# Patient Record
Sex: Female | Born: 1940 | Race: White | Hispanic: No
Health system: Southern US, Community
[De-identification: ages and names within clinical notes are randomized; demographics above are authoritative.]

## PROBLEM LIST (undated history)

## (undated) ENCOUNTER — Emergency Department (HOSPITAL_COMMUNITY): Payer: Self-pay

---

## 1998-08-15 ENCOUNTER — Ambulatory Visit (HOSPITAL_COMMUNITY): Admission: RE | Admit: 1998-08-15 | Discharge: 1998-08-15 | Payer: Self-pay | Admitting: *Deleted

## 1999-03-01 ENCOUNTER — Other Ambulatory Visit: Admission: RE | Admit: 1999-03-01 | Discharge: 1999-03-01 | Payer: Self-pay | Admitting: *Deleted

## 2000-03-28 ENCOUNTER — Ambulatory Visit (HOSPITAL_COMMUNITY): Admission: RE | Admit: 2000-03-28 | Discharge: 2000-03-28 | Payer: Self-pay | Admitting: *Deleted

## 2001-07-04 ENCOUNTER — Other Ambulatory Visit: Admission: RE | Admit: 2001-07-04 | Discharge: 2001-07-04 | Payer: Self-pay | Admitting: Internal Medicine

## 2003-09-10 ENCOUNTER — Ambulatory Visit (HOSPITAL_COMMUNITY): Admission: RE | Admit: 2003-09-10 | Discharge: 2003-09-10 | Payer: Self-pay | Admitting: Internal Medicine

## 2005-01-25 ENCOUNTER — Ambulatory Visit (HOSPITAL_COMMUNITY): Admission: RE | Admit: 2005-01-25 | Discharge: 2005-01-25 | Payer: Self-pay | Admitting: Internal Medicine

## 2005-01-25 ENCOUNTER — Encounter (INDEPENDENT_AMBULATORY_CARE_PROVIDER_SITE_OTHER): Payer: Self-pay | Admitting: Cardiology

## 2005-02-12 ENCOUNTER — Encounter: Admission: RE | Admit: 2005-02-12 | Discharge: 2005-02-12 | Payer: Self-pay | Admitting: Cardiology

## 2005-02-19 ENCOUNTER — Ambulatory Visit (HOSPITAL_COMMUNITY): Admission: RE | Admit: 2005-02-19 | Discharge: 2005-02-19 | Payer: Self-pay | Admitting: Cardiology

## 2005-05-31 ENCOUNTER — Ambulatory Visit: Admission: RE | Admit: 2005-05-31 | Discharge: 2005-05-31 | Payer: Self-pay | Admitting: *Deleted

## 2005-10-10 ENCOUNTER — Ambulatory Visit (HOSPITAL_COMMUNITY): Admission: RE | Admit: 2005-10-10 | Discharge: 2005-10-10 | Payer: Self-pay | Admitting: Internal Medicine

## 2005-10-10 ENCOUNTER — Encounter: Payer: Self-pay | Admitting: Vascular Surgery

## 2007-07-09 ENCOUNTER — Ambulatory Visit (HOSPITAL_COMMUNITY): Admission: RE | Admit: 2007-07-09 | Discharge: 2007-07-09 | Payer: Self-pay | Admitting: Internal Medicine

## 2007-11-13 ENCOUNTER — Ambulatory Visit: Admission: RE | Admit: 2007-11-13 | Discharge: 2008-01-14 | Payer: Self-pay | Admitting: Radiation Oncology

## 2007-11-14 ENCOUNTER — Ambulatory Visit: Payer: Self-pay | Admitting: Oncology

## 2007-11-20 LAB — COMPREHENSIVE METABOLIC PANEL
Albumin: 2.8 g/dL — ABNORMAL LOW (ref 3.5–5.2)
Alkaline Phosphatase: 637 U/L — ABNORMAL HIGH (ref 39–117)
BUN: 6 mg/dL (ref 6–23)
CO2: 26 mEq/L (ref 19–32)
Calcium: 8.8 mg/dL (ref 8.4–10.5)
Chloride: 97 mEq/L (ref 96–112)
Glucose, Bld: 105 mg/dL — ABNORMAL HIGH (ref 70–99)
Potassium: 3.3 mEq/L — ABNORMAL LOW (ref 3.5–5.3)
Sodium: 135 mEq/L (ref 135–145)
Total Protein: 5.6 g/dL — ABNORMAL LOW (ref 6.0–8.3)

## 2007-11-20 LAB — CBC WITH DIFFERENTIAL/PLATELET
Basophils Absolute: 0.2 10*3/uL — ABNORMAL HIGH (ref 0.0–0.1)
EOS%: 0.9 % (ref 0.0–7.0)
HCT: 38.3 % (ref 34.8–46.6)
HGB: 12.9 g/dL (ref 11.6–15.9)
LYMPH%: 12.5 % — ABNORMAL LOW (ref 14.0–48.0)
MCH: 31 pg (ref 26.0–34.0)
MCV: 92.3 fL (ref 81.0–101.0)
MONO%: 7.5 % (ref 0.0–13.0)
NEUT%: 77.4 % — ABNORMAL HIGH (ref 39.6–76.8)
Platelets: 308 10*3/uL (ref 145–400)
lymph#: 1.1 10*3/uL (ref 0.9–3.3)

## 2007-11-21 ENCOUNTER — Ambulatory Visit (HOSPITAL_COMMUNITY): Admission: RE | Admit: 2007-11-21 | Discharge: 2007-11-22 | Payer: Self-pay | Admitting: Oncology

## 2007-11-26 ENCOUNTER — Ambulatory Visit (HOSPITAL_COMMUNITY): Admission: RE | Admit: 2007-11-26 | Discharge: 2007-11-26 | Payer: Self-pay | Admitting: Oncology

## 2007-11-27 ENCOUNTER — Ambulatory Visit: Admission: RE | Admit: 2007-11-27 | Discharge: 2007-11-27 | Payer: Self-pay | Admitting: Oncology

## 2007-11-27 ENCOUNTER — Encounter: Payer: Self-pay | Admitting: Oncology

## 2007-11-27 ENCOUNTER — Ambulatory Visit: Payer: Self-pay | Admitting: Vascular Surgery

## 2007-11-28 ENCOUNTER — Ambulatory Visit (HOSPITAL_COMMUNITY): Admission: RE | Admit: 2007-11-28 | Discharge: 2007-11-28 | Payer: Self-pay | Admitting: Interventional Radiology

## 2007-12-08 LAB — COMPREHENSIVE METABOLIC PANEL
ALT: 28 U/L (ref 0–35)
CO2: 25 mEq/L (ref 19–32)
Calcium: 8.7 mg/dL (ref 8.4–10.5)
Chloride: 95 mEq/L — ABNORMAL LOW (ref 96–112)
Creatinine, Ser: 0.51 mg/dL (ref 0.40–1.20)
Glucose, Bld: 103 mg/dL — ABNORMAL HIGH (ref 70–99)
Total Bilirubin: 2 mg/dL — ABNORMAL HIGH (ref 0.3–1.2)

## 2007-12-08 LAB — CBC WITH DIFFERENTIAL/PLATELET
BASO%: 1 % (ref 0.0–2.0)
Basophils Absolute: 0 10*3/uL (ref 0.0–0.1)
EOS%: 0.3 % (ref 0.0–7.0)
Eosinophils Absolute: 0 10*3/uL (ref 0.0–0.5)
HCT: 31.8 % — ABNORMAL LOW (ref 34.8–46.6)
HGB: 11.4 g/dL — ABNORMAL LOW (ref 11.6–15.9)
LYMPH%: 11.2 % — ABNORMAL LOW (ref 14.0–48.0)
MCH: 32 pg (ref 26.0–34.0)
MCHC: 35.9 g/dL (ref 32.0–36.0)
MCV: 89.3 fL (ref 81.0–101.0)
MONO#: 0.3 10*3/uL (ref 0.1–0.9)
MONO%: 7.4 % (ref 0.0–13.0)
NEUT#: 3 10*3/uL (ref 1.5–6.5)
NEUT%: 80.1 % — ABNORMAL HIGH (ref 39.6–76.8)
Platelets: 158 10*3/uL (ref 145–400)
RBC: 3.56 10*6/uL — ABNORMAL LOW (ref 3.70–5.32)
RDW: 14 % (ref 11.3–14.5)
WBC: 3.8 10*3/uL — ABNORMAL LOW (ref 3.9–10.0)
lymph#: 0.4 10*3/uL — ABNORMAL LOW (ref 0.9–3.3)

## 2007-12-12 LAB — CBC WITH DIFFERENTIAL/PLATELET
BASO%: 0.4 % (ref 0.0–2.0)
Eosinophils Absolute: 0 10*3/uL (ref 0.0–0.5)
HCT: 31 % — ABNORMAL LOW (ref 34.8–46.6)
MCHC: 34 g/dL (ref 32.0–36.0)
MONO#: 0 10*3/uL — ABNORMAL LOW (ref 0.1–0.9)
NEUT#: 4.9 10*3/uL (ref 1.5–6.5)
Platelets: 95 10*3/uL — ABNORMAL LOW (ref 145–400)
RBC: 3.47 10*6/uL — ABNORMAL LOW (ref 3.70–5.32)
WBC: 5.2 10*3/uL (ref 3.9–10.0)
lymph#: 0.2 10*3/uL — ABNORMAL LOW (ref 0.9–3.3)

## 2007-12-15 LAB — CBC WITH DIFFERENTIAL/PLATELET
Basophils Absolute: 0 10*3/uL (ref 0.0–0.1)
Eosinophils Absolute: 0 10*3/uL (ref 0.0–0.5)
HCT: 31.6 % — ABNORMAL LOW (ref 34.8–46.6)
HGB: 10.9 g/dL — ABNORMAL LOW (ref 11.6–15.9)
LYMPH%: 8.3 % — ABNORMAL LOW (ref 14.0–48.0)
MONO#: 0.2 10*3/uL (ref 0.1–0.9)
NEUT%: 84.6 % — ABNORMAL HIGH (ref 39.6–76.8)
Platelets: 47 10*3/uL — ABNORMAL LOW (ref 145–400)
WBC: 2.7 10*3/uL — ABNORMAL LOW (ref 3.9–10.0)
lymph#: 0.2 10*3/uL — ABNORMAL LOW (ref 0.9–3.3)

## 2007-12-25 ENCOUNTER — Ambulatory Visit: Payer: Self-pay | Admitting: Oncology

## 2007-12-31 LAB — CBC WITH DIFFERENTIAL/PLATELET
BASO%: 0 % (ref 0.0–2.0)
Basophils Absolute: 0 10*3/uL (ref 0.0–0.1)
EOS%: 0.2 % (ref 0.0–7.0)
Eosinophils Absolute: 0 10*3/uL (ref 0.0–0.5)
HCT: 29.4 % — ABNORMAL LOW (ref 34.8–46.6)
HGB: 10 g/dL — ABNORMAL LOW (ref 11.6–15.9)
LYMPH%: 7.6 % — ABNORMAL LOW (ref 14.0–48.0)
MCH: 31.2 pg (ref 26.0–34.0)
MCHC: 34 g/dL (ref 32.0–36.0)
MCV: 91.9 fL (ref 81.0–101.0)
MONO#: 0.9 10*3/uL (ref 0.1–0.9)
MONO%: 7.9 % (ref 0.0–13.0)
NEUT#: 9.2 10*3/uL — ABNORMAL HIGH (ref 1.5–6.5)
NEUT%: 84.3 % — ABNORMAL HIGH (ref 39.6–76.8)
Platelets: 216 10*3/uL (ref 145–400)
RBC: 3.2 10*6/uL — ABNORMAL LOW (ref 3.70–5.32)
RDW: 15.8 % — ABNORMAL HIGH (ref 11.3–14.5)
WBC: 10.9 10*3/uL — ABNORMAL HIGH (ref 3.9–10.0)
lymph#: 0.8 10*3/uL — ABNORMAL LOW (ref 0.9–3.3)

## 2008-01-01 LAB — COMPREHENSIVE METABOLIC PANEL
ALT: 17 U/L (ref 0–35)
AST: 18 U/L (ref 0–37)
Albumin: 2.9 g/dL — ABNORMAL LOW (ref 3.5–5.2)
Alkaline Phosphatase: 118 U/L — ABNORMAL HIGH (ref 39–117)
BUN: 12 mg/dL (ref 6–23)
CO2: 23 mEq/L (ref 19–32)
Calcium: 8.1 mg/dL — ABNORMAL LOW (ref 8.4–10.5)
Chloride: 98 mEq/L (ref 96–112)
Creatinine, Ser: 0.43 mg/dL (ref 0.40–1.20)
Glucose, Bld: 134 mg/dL — ABNORMAL HIGH (ref 70–99)
Potassium: 3.9 mEq/L (ref 3.5–5.3)
Sodium: 133 mEq/L — ABNORMAL LOW (ref 135–145)
Total Bilirubin: 0.7 mg/dL (ref 0.3–1.2)
Total Protein: 5.3 g/dL — ABNORMAL LOW (ref 6.0–8.3)

## 2008-01-01 LAB — CANCER ANTIGEN 19-9: CA 19-9: 421.1 U/mL — ABNORMAL HIGH (ref <35.0)

## 2008-01-21 LAB — COMPREHENSIVE METABOLIC PANEL
ALT: 16 U/L (ref 0–35)
AST: 22 U/L (ref 0–37)
Albumin: 2.7 g/dL — ABNORMAL LOW (ref 3.5–5.2)
Alkaline Phosphatase: 108 U/L (ref 39–117)
BUN: 14 mg/dL (ref 6–23)
CO2: 24 mEq/L (ref 19–32)
Calcium: 8 mg/dL — ABNORMAL LOW (ref 8.4–10.5)
Chloride: 96 mEq/L (ref 96–112)
Creatinine, Ser: 0.44 mg/dL (ref 0.40–1.20)
Glucose, Bld: 97 mg/dL (ref 70–99)
Potassium: 3.5 mEq/L (ref 3.5–5.3)
Sodium: 133 mEq/L — ABNORMAL LOW (ref 135–145)
Total Bilirubin: 0.6 mg/dL (ref 0.3–1.2)
Total Protein: 5.1 g/dL — ABNORMAL LOW (ref 6.0–8.3)

## 2008-01-21 LAB — CBC WITH DIFFERENTIAL/PLATELET
BASO%: 1.5 % (ref 0.0–2.0)
Basophils Absolute: 0.1 10*3/uL (ref 0.0–0.1)
EOS%: 0.2 % (ref 0.0–7.0)
Eosinophils Absolute: 0 10*3/uL (ref 0.0–0.5)
HCT: 33.4 % — ABNORMAL LOW (ref 34.8–46.6)
HGB: 11.2 g/dL — ABNORMAL LOW (ref 11.6–15.9)
LYMPH%: 11.4 % — ABNORMAL LOW (ref 14.0–48.0)
MCH: 30.3 pg (ref 26.0–34.0)
MCHC: 33.6 g/dL (ref 32.0–36.0)
MCV: 90.2 fL (ref 81.0–101.0)
MONO#: 0.7 10*3/uL (ref 0.1–0.9)
MONO%: 8.4 % (ref 0.0–13.0)
NEUT#: 6.7 10*3/uL — ABNORMAL HIGH (ref 1.5–6.5)
NEUT%: 78.5 % — ABNORMAL HIGH (ref 39.6–76.8)
Platelets: 177 10*3/uL (ref 145–400)
RBC: 3.71 10*6/uL (ref 3.70–5.32)
RDW: 15.9 % — ABNORMAL HIGH (ref 11.3–14.5)
WBC: 8.5 10*3/uL (ref 3.9–10.0)
lymph#: 1 10*3/uL (ref 0.9–3.3)

## 2008-01-21 LAB — CANCER ANTIGEN 19-9: CA 19-9: 1048.4 U/mL — ABNORMAL HIGH (ref <35.0)

## 2008-01-26 ENCOUNTER — Ambulatory Visit (HOSPITAL_COMMUNITY): Admission: RE | Admit: 2008-01-26 | Discharge: 2008-01-26 | Payer: Self-pay | Admitting: Diagnostic Radiology

## 2008-02-09 ENCOUNTER — Ambulatory Visit: Payer: Self-pay | Admitting: Oncology

## 2008-10-15 IMAGING — XA IR BILIARY STRICTURE DILATION W/O STENT
1 series · 13 of 13 positions shown · non-contrast
Comparison: none

EXAMINATION:

CHOLANGIOGRAM THROUGH EXISTING CATHETER
INTERNAL COMMON BILE DUCT STENT INSERTION
REMOVAL OF INTERNAL/EXTERNAL BILIARY DRAINAGE CATHETER
CLINICAL DATA: 67-year-old female inoperable pancreas cancer, status
post internal/external biliary drainage catheter insertion.
Palliative care.  Patient request removal of the drainage catheter
if possible.  She is to have no further chemotherapy or radiation
therapy.

[Series 1000: run · 0.13mm/px · 13 of 13 slices shown]
[im 1/13]
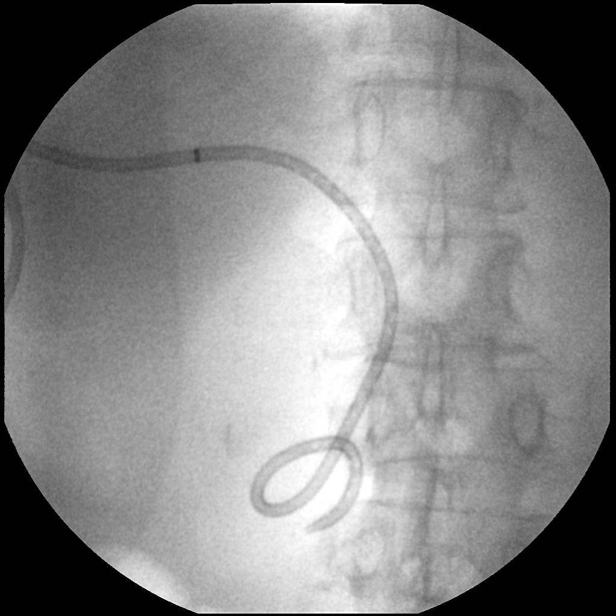
[im 2/13]
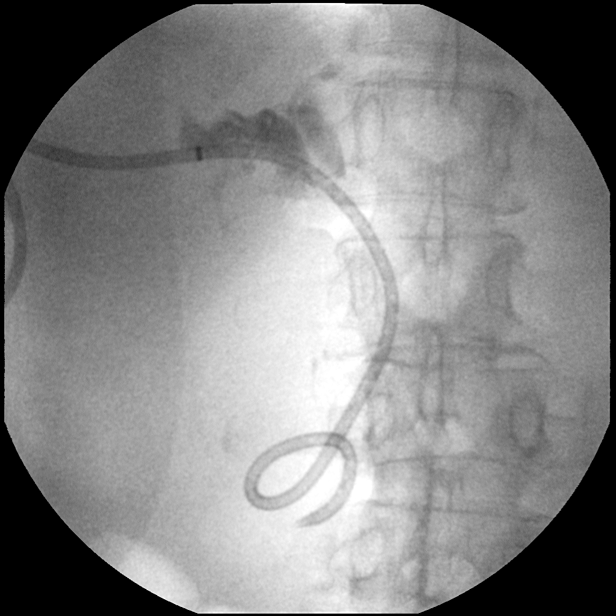
[im 3/13]
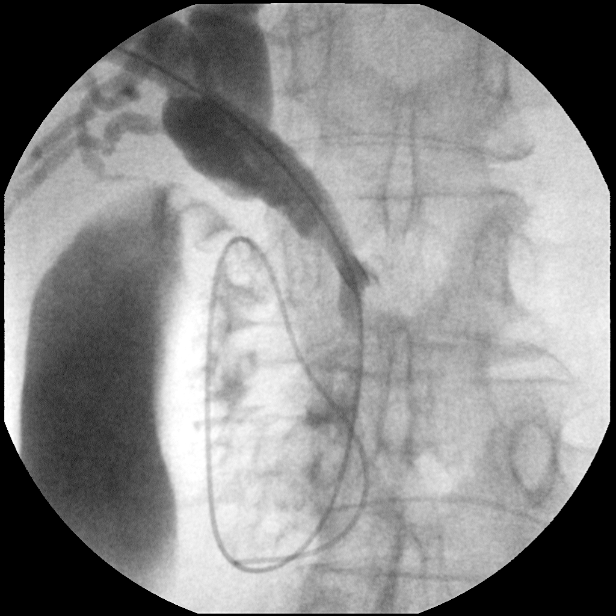
[im 4/13]
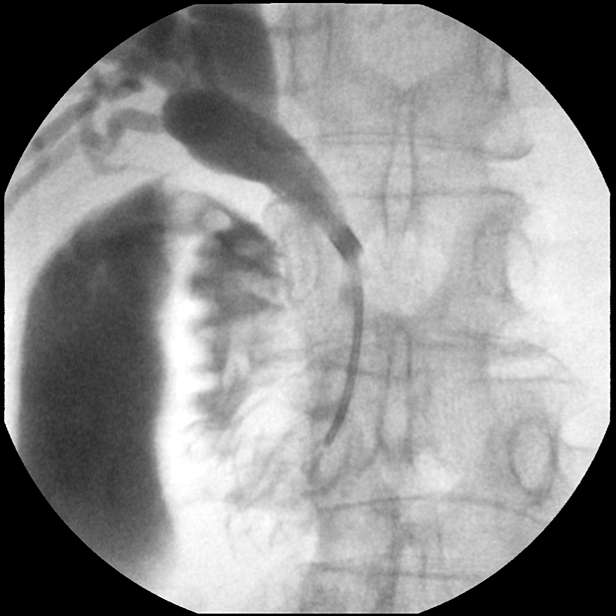
[im 5/13]
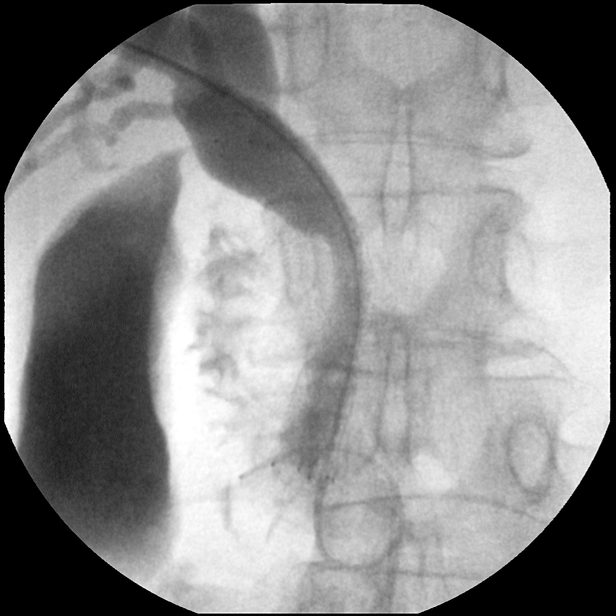
[im 6/13]
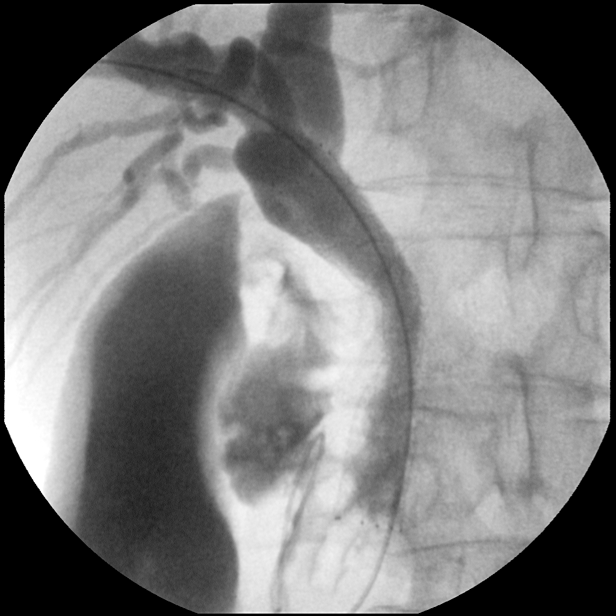
[im 7/13]
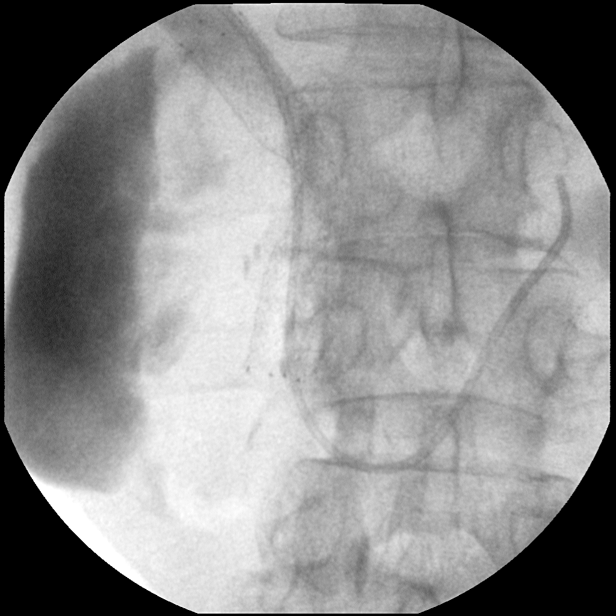
[im 8/13]
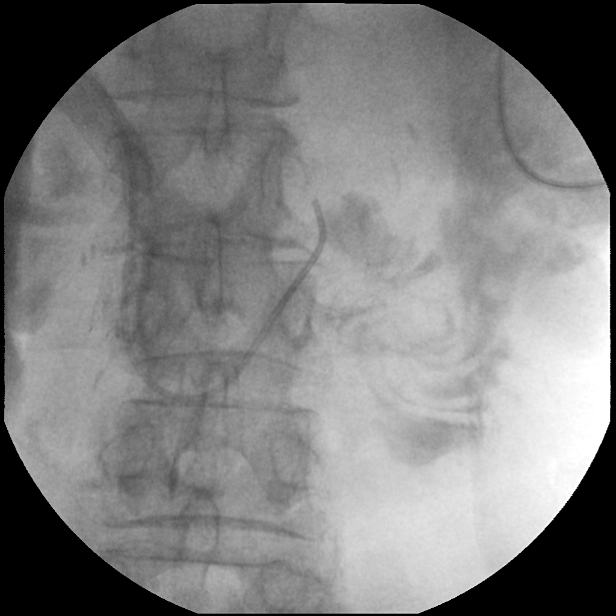
[im 9/13]
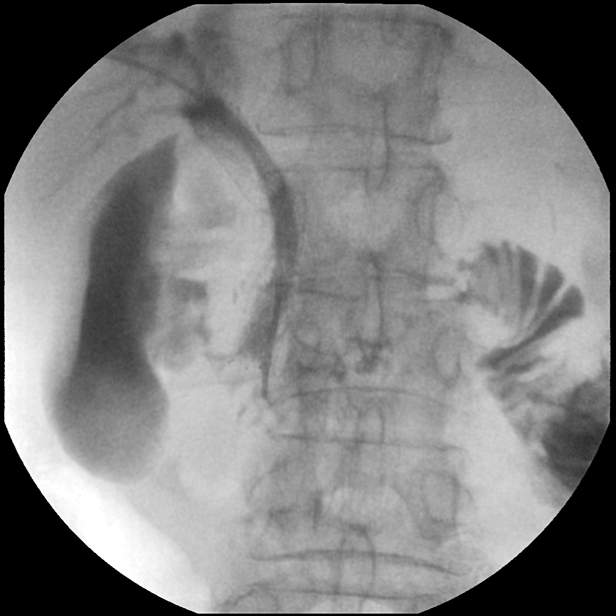
[im 10/13]
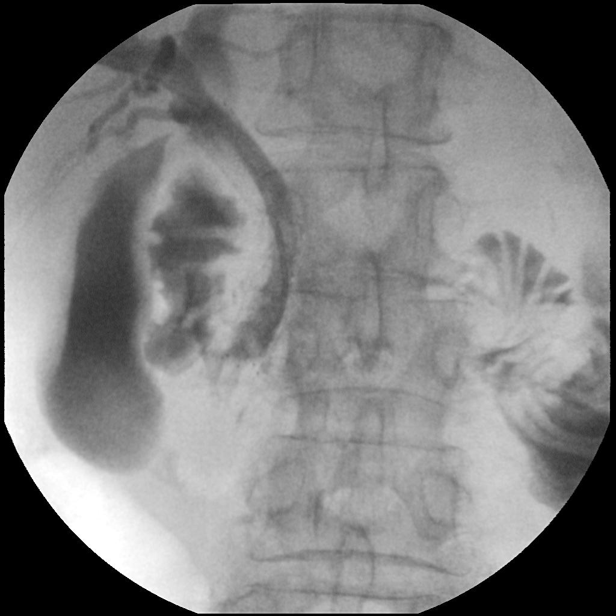
[im 11/13]
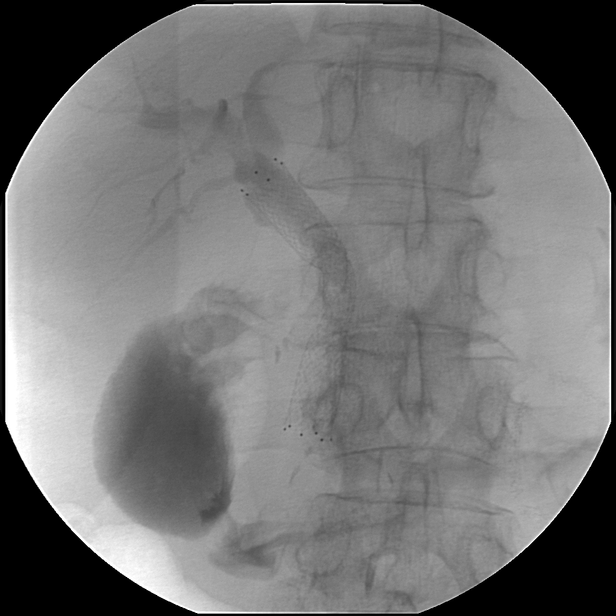
[im 12/13]
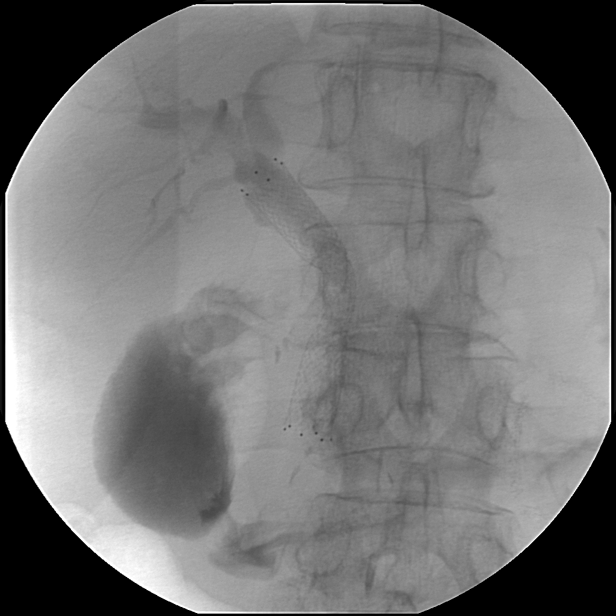
[im 13/13]
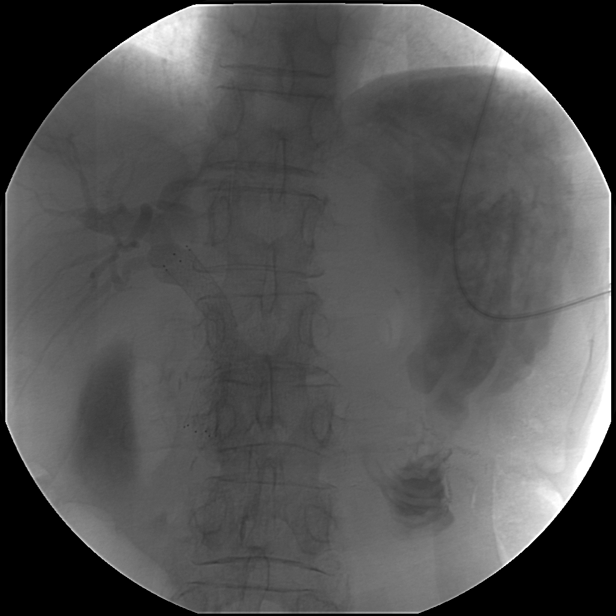

[13 of 13 positions shown; findings below may reference images not displayed]

Date:01/26/2008 [DATE]

Radiologist:IBRO LAKATOS.Nana Kwarteng, M.D.

Medications:400 mg Cipro, 1 mg Versed, 50 mcg Fentanyl

Guidance:Fluoroscopic

Fluoroscopy time:17.5 minutes

Sedation time:40 minutes

Contrast volume:50 ml Omnipaque

Complications:No immediate

PROCEDURE/FINDINGS:

Informed consent was obtained from the patient following
explanation of the procedure, risks, benefits and alternatives.
The patient understands, agrees and consents for the procedure.
All questions were addressed.  A time out was performed.

Under sterile conditions and local anesthesia, the existing
internal/external biliary drainage catheter was cut and removed
over a Bentson guide wire.  A 9-French sheath was inserted.
Through the sheath, contrast injection was performed for a
cholangiogram.  This again demonstrates a severe narrowing versus
occlusion of the common bile duct.  Guide wire access was obtained
into the duodenum.  Through the 9 fr sheath, a 10 mm x 60 mm Cordis
Smart self-expanding nitinol stent was deployed across the common
bile duct obstruction extending just into the duodenum and
proximally into the common bile duct.  Repeat cholangiogram
demonstrates patency of the common bile duct stent across the
obstructed portion..  Contrast opacifies the biliary system,
duodenum, gallbladder, and stomach. Biliary system was decompressed
by syringe aspiration.  Images were obtained for documentation.
Percutaneous transhepatic access was removed.  No immediate
complication.  The patient tolerated the procedure well.
IMPRESSION: Successful internal permanent common bile duct stent
insertion across the obstruction .

Removal of internal/external biliary drainage catheter.

## 2010-05-15 ENCOUNTER — Encounter: Payer: Self-pay | Admitting: Oncology

## 2010-09-08 NOTE — H&P (Signed)
NAME:  Ray, [name]                  ACCOUNT NO.:  [id number]   MEDICAL RECORD NO.:  [id number]          PATIENT TYPE:  AMB   LOCATION:  SDS                          FACILITY:  [facility]   PHYSICIAN:  Sanjeev K. Deveshwar, M.D.DATE OF BIRTH:  Sep 01, 1940   DATE OF ADMISSION:  02/19/2005  DATE OF DISCHARGE:                                HISTORY & PHYSICAL   CHIEF COMPLAINT:  The patient is here for cerebral angiogram today.   HISTORY OF PRESENT ILLNESS:  This is a pleasant 70 year old female with a  history of severe headaches. She recently had an MRI/MRA performed on  February 12, 2005. This showed a question of a 6.2 mm by 3.5 mm saccular  outpouching of the left cavernous carotid artery, as well as a 3.5 mm x 2.5  mm outpouching of the superior hypophyseal artery of the right internal  carotid. Both of these were felt to be possibly represent aneurysms. A  cerebral angiogram was recommended for further evaluation. The patient  presents today for that procedure.   PAST MEDICAL HISTORY:  Significant for hyperlipidemia. The patient was  recently evaluated by Dr. Lucas Mallow for a heart murmur. However, the patient  reports this turned out to be benign. She also has a history of a subclavian  steal syndrome. She reports having a heart catheterization approximately 13  years ago which she feels did not show any significant coronary disease;  however, we do not have these records.   SURGICAL HISTORY:  Significant for three knee surgeries.   ALLERGIES:  CODEINE causes confusion and visual changes.   CURRENT MEDICATIONS:  Include Crestor 20 mg daily.   SOCIAL HISTORY:  The patient is widowed. She has three children. She lives  alone in [place]. She has smoke one pack per day for at least 40 years.  She drinks 3-6 ounces of alcohol per day. She is retired.   FAMILY HISTORY:  Her mother died in her 28s. She had gastric cancer, heart  trouble, and a brain aneurysm in her 41s. Her father died at  age 32 from  pancreatic CA.   REVIEW OF SYSTEMS:  Significant for mild dyspnea on exertion, the above-  noted headaches, occasional blurred vision, occasional nausea, intermittent  left upper extremity numbness x1 year, and occasionally she notes pulsatile  tinnitus.   LABORATORY DATA:  Pending.   PHYSICAL EXAMINATION:  GENERAL:  Vital signs pending. Otherwise, this is a  pleasant 70 year old white female in no acute distress with a somewhat  hoarse-sounding voice.  HEENT:  Unremarkable.  NECK:  Reveals a left carotid bruit.  HEART:  Reveals regular rate and rhythm with a grade 1-2/6 systolic murmur.  LUNGS:  Clear but slightly decreased.  ABDOMEN:  Soft, nontender.  EXTREMITIES:  Reveal pulses to be intact without significant edema.  SKIN:  Warm and dry.  NEUROLOGIC:  Mental status:  The patient is alert and oriented and follows  commands. Cranial nerves II-XII are grossly intact. Sensation is intact to  light touch, although it is slightly diminished in the left upper extremity.  Motor strength is  approximately 4/5 throughout. Cerebellar testing was  performed without difficulty.   Her airway was rated at a 1. Her ASA scale was rated at a 2. Please note  this exam was performed in a very noisy environment.   IMPRESSION:  1.  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  2.  History of subclavian steal syndrome.  3.  History of hyperlipidemia.  4.  Ongoing tobacco use, question chronic obstructive pulmonary disease.  5.  Heart catheterization approximately 13 years ago, details not available.  6.  History of knee surgeries x3.  7.  Intolerance to CODEINE.  8.  History of severe headaches as well as intermittent left-sided numbness.      Delton See, P.A.    ______________________________  Grandville Silos. Corliss Skains, M.D.    DR/MEDQ  D:  02/19/2005  T:  02/19/2005  Job:  [id number]   cc:   Jaclyn Prime. Lucas Mallow, M.D.  Fax: [phone]   Lovenia Kim,  D.O.  Fax: [phone]

## 2011-01-19 LAB — COMPREHENSIVE METABOLIC PANEL
ALT: 63 — ABNORMAL HIGH
AST: 172 — ABNORMAL HIGH
AST: 30
AST: 51 — ABNORMAL HIGH
Albumin: 2.1 — ABNORMAL LOW
Albumin: 2.6 — ABNORMAL LOW
Alkaline Phosphatase: 479 — ABNORMAL HIGH
BUN: 3 — ABNORMAL LOW
CO2: 28
Calcium: 8.5
Calcium: 9.1
Chloride: 101
Chloride: 99
Creatinine, Ser: 0.57
Creatinine, Ser: 0.58
GFR calc Af Amer: >60
GFR calc Af Amer: >60
GFR calc Af Amer: >60
GFR calc non Af Amer: >60
Glucose, Bld: 92
Potassium: 3.3 — ABNORMAL LOW
Sodium: 134 — ABNORMAL LOW
Sodium: 134 — ABNORMAL LOW
Sodium: 136
Total Bilirubin: 3.2 — ABNORMAL HIGH
Total Protein: 4.2 — ABNORMAL LOW
Total Protein: 6.1

## 2011-01-19 LAB — CBC
MCHC: 33.3
MCHC: 34.2
MCV: 92.3
MCV: 93.5
Platelets: 153
Platelets: 176
RBC: 3.82 — ABNORMAL LOW
RBC: 4.3
RDW: 17.4 — ABNORMAL HIGH
RDW: 18.1 — ABNORMAL HIGH
WBC: 6.9
WBC: 7.4

## 2011-01-19 LAB — PROTIME-INR
INR: 1
Prothrombin Time: 13.8

## 2012-07-21 ENCOUNTER — Encounter: Payer: Self-pay | Admitting: Gastroenterology

## 2013-11-18 ENCOUNTER — Encounter: Payer: Self-pay | Admitting: Gastroenterology

## 2015-11-24 NOTE — Progress Notes (Signed)
Order(s) created erroneously. Erroneous order ID: [id number]  Order moved by: Smith Mince  Order move date/time: 11/24/2015 11:20 AM  Source Patient: [id number]  Source Contact: 11/22/2015  Destination Patient: [id number]  Destination Contact: 10/24/2015
